# Patient Record
Sex: Female | Born: 1970 | Race: Asian | Hispanic: No | Marital: Married | Smoking: Never smoker
Health system: Southern US, Community
[De-identification: ages and names within clinical notes are randomized; demographics above are authoritative.]

---

## 2020-07-18 ENCOUNTER — Other Ambulatory Visit: Payer: Self-pay

## 2020-07-18 ENCOUNTER — Emergency Department (HOSPITAL_COMMUNITY)
Admission: EM | Admit: 2020-07-18 | Discharge: 2020-07-18 | Disposition: A | Discharge status: Home / Self Care | Payer: BC Managed Care – PPO | Attending: Emergency Medicine | Admitting: Emergency Medicine

## 2020-07-18 ENCOUNTER — Emergency Department (HOSPITAL_COMMUNITY): Payer: BC Managed Care – PPO

## 2020-07-18 ENCOUNTER — Encounter (HOSPITAL_COMMUNITY): Payer: Self-pay

## 2020-07-18 DIAGNOSIS — M542 Cervicalgia: Secondary | ICD-10-CM | POA: Insufficient documentation

## 2020-07-18 DIAGNOSIS — R42 Dizziness and giddiness: Secondary | ICD-10-CM | POA: Diagnosis not present

## 2020-07-18 DIAGNOSIS — R55 Syncope and collapse: Secondary | ICD-10-CM | POA: Diagnosis present

## 2020-07-18 DIAGNOSIS — M62838 Other muscle spasm: Secondary | ICD-10-CM

## 2020-07-18 LAB — I-STAT BETA HCG BLOOD, ED (MC, WL, AP ONLY): I-stat hCG, quantitative: <5 m[IU]/mL (ref <5)

## 2020-07-18 LAB — CBC WITH DIFFERENTIAL/PLATELET
Abs Immature Granulocytes: 0.01 10*3/uL (ref 0.00–0.07)
Basophils Absolute: 0 10*3/uL (ref 0.0–0.1)
Basophils Relative: 0 %
Eosinophils Absolute: 0 10*3/uL (ref 0.0–0.5)
Eosinophils Relative: 1 %
HCT: 37.3 % (ref 36.0–46.0)
Hemoglobin: 12.3 g/dL (ref 12.0–15.0)
Immature Granulocytes: 0 %
Lymphocytes Relative: 26 %
Lymphs Abs: 1.5 10*3/uL (ref 0.7–4.0)
MCH: 31.7 pg (ref 26.0–34.0)
MCHC: 33 g/dL (ref 30.0–36.0)
MCV: 96.1 fL (ref 80.0–100.0)
Monocytes Absolute: 0.4 10*3/uL (ref 0.1–1.0)
Monocytes Relative: 6 %
Neutro Abs: 3.9 10*3/uL (ref 1.7–7.7)
Neutrophils Relative %: 67 %
Platelets: 205 10*3/uL (ref 150–400)
RBC: 3.88 MIL/uL (ref 3.87–5.11)
RDW: 11.9 % (ref 11.5–15.5)
WBC: 5.8 10*3/uL (ref 4.0–10.5)
nRBC: 0 % (ref 0.0–0.2)

## 2020-07-18 LAB — I-STAT CHEM 8, ED
BUN: 15 mg/dL (ref 6–20)
Calcium, Ion: 1.2 mmol/L (ref 1.15–1.40)
Chloride: 103 mmol/L (ref 98–111)
Creatinine, Ser: 0.7 mg/dL (ref 0.44–1.00)
Glucose, Bld: 105 mg/dL — ABNORMAL HIGH (ref 70–99)
HCT: 34 % — ABNORMAL LOW (ref 36.0–46.0)
Hemoglobin: 11.6 g/dL — ABNORMAL LOW (ref 12.0–15.0)
Potassium: 3.5 mmol/L (ref 3.5–5.1)
Sodium: 143 mmol/L (ref 135–145)
TCO2: 31 mmol/L (ref 22–32)

## 2020-07-18 LAB — COMPREHENSIVE METABOLIC PANEL
ALT: 22 U/L (ref 0–44)
AST: 21 U/L (ref 15–41)
Albumin: 3.8 g/dL (ref 3.5–5.0)
Alkaline Phosphatase: 56 U/L (ref 38–126)
Anion gap: 8 (ref 5–15)
BUN: 12 mg/dL (ref 6–20)
CO2: 30 mmol/L (ref 22–32)
Calcium: 8.9 mg/dL (ref 8.9–10.3)
Chloride: 103 mmol/L (ref 98–111)
Creatinine, Ser: 0.75 mg/dL (ref 0.44–1.00)
GFR, Estimated: >60 mL/min (ref >60)
Glucose, Bld: 101 mg/dL — ABNORMAL HIGH (ref 70–99)
Potassium: 3.4 mmol/L — ABNORMAL LOW (ref 3.5–5.1)
Sodium: 141 mmol/L (ref 135–145)
Total Bilirubin: 0.7 mg/dL (ref 0.3–1.2)
Total Protein: 6.5 g/dL (ref 6.5–8.1)

## 2020-07-18 LAB — TROPONIN I (HIGH SENSITIVITY)
Troponin I (High Sensitivity): 2 ng/L (ref <18)
Troponin I (High Sensitivity): 3 ng/L (ref <18)

## 2020-07-18 MED ORDER — METHOCARBAMOL 500 MG PO TABS: 500.0000 mg | ORAL_TABLET | Freq: Two times a day (BID) | ORAL | 0 refills | Status: AC

## 2020-07-18 MED ORDER — IOHEXOL 350 MG/ML SOLN
50.0000 mL | Freq: Once | INTRAVENOUS | Status: AC | PRN
  Administered 2020-07-18: 50 mL via INTRAVENOUS

## 2020-07-18 MED ORDER — IBUPROFEN 600 MG PO TABS: 600.0000 mg | ORAL_TABLET | Freq: Four times a day (QID) | ORAL | 0 refills | Status: AC | PRN

## 2020-07-18 MED ORDER — SODIUM CHLORIDE 0.9 % IV BOLUS
1000.0000 mL | Freq: Once | INTRAVENOUS | Status: AC
  Administered 2020-07-18: 1000 mL via INTRAVENOUS

## 2020-07-18 MED ORDER — CYCLOBENZAPRINE HCL 10 MG PO TABS
5.0000 mg | ORAL_TABLET | Freq: Once | ORAL | Status: AC
  Administered 2020-07-18: 5 mg via ORAL
  Filled 2020-07-18: qty 1

## 2020-07-18 MED ORDER — KETOROLAC TROMETHAMINE 30 MG/ML IJ SOLN
30.0000 mg | Freq: Once | INTRAMUSCULAR | Status: AC
  Administered 2020-07-18: 30 mg via INTRAVENOUS
  Filled 2020-07-18: qty 1

## 2020-07-18 NOTE — Discharge Instructions (Addendum)
You have neck spasms and likely have menopause symptoms   Take Motrin for pain.  Take robaxin for severe spasms   See your doctor for follow up.  Consider following up with cardiology if you persistent syncope  Return to ER if you have worse neck pain, chest pain, passing out

## 2020-07-18 NOTE — ED Triage Notes (Signed)
Patient at work, unaware of sudden syncopal episode. Reports some dizziness prior to fall. Unknown if she hit her head. "feels like she is drunk." Reports neck pain that started last night before fall. VSS, CBG normal. No hx of episodes like this.

## 2020-07-18 NOTE — ED Provider Notes (Signed)
[facility] EMERGENCY DEPARTMENT Provider Note   CSN: [id number] Arrival date & time: 07/18/20  1700     History Chief Complaint  Patient presents with  . Loss of Consciousness    Meredith Smith is a 50 y.o. female here with dizziness and near syncopal episode.  Patient states that about a week ago she had a rash and went to urgent care was given some steroids and then she had a first episode of near syncope soon after that.  She states that she has been having neck pain for the last several days.  She also states that she has menopause symptoms and just felt lightheaded and dizzy.  Patient states that today she went to work and her neck was hurting on the left side.  She turned her neck and she almost passed out.  She denies any falls or actually passing out or hitting her head.  Denies any trouble speaking or weakness or numbness.  The history is provided by the patient.       History reviewed. No pertinent past medical history.  There are no problems to display for this patient.   History reviewed. No pertinent surgical history.   OB History   No obstetric history on file.     History reviewed. No pertinent family history.  Social History   Tobacco Use  . Smoking status: Never Smoker  . Smokeless tobacco: Never Used    Home Medications Prior to Admission medications   Not on File    Allergies    Patient has no known allergies.  Review of Systems   Review of Systems  Cardiovascular: Positive for syncope.  Neurological: Positive for dizziness and light-headedness.  All other systems reviewed and are negative.   Physical Exam Updated Vital Signs BP (!) 145/79   Pulse 68   Temp 98.4 F (36.9 C) (Oral)   Resp 16   Ht 5\' 4"  (1.626 m)   Wt 55.3 kg   SpO2 98%   BMI 20.94 kg/m   Physical Exam Vitals and nursing note reviewed.  Constitutional:      Appearance: Normal appearance.  HENT:     Head: Normocephalic.     Nose: Nose normal.      Mouth/Throat:     Mouth: Mucous membranes are moist.  Eyes:     Extraocular Movements: Extraocular movements intact.     Pupils: Pupils are equal, round, and reactive to light.  Neck:     Comments: + Patient has some left para cervical tenderness.  No obvious bruit.  No meningeal sign  Cardiovascular:     Rate and Rhythm: Normal rate and regular rhythm.     Pulses: Normal pulses.     Heart sounds: Normal heart sounds.  Pulmonary:     Effort: Pulmonary effort is normal.     Breath sounds: Normal breath sounds.  Abdominal:     General: Abdomen is flat.     Palpations: Abdomen is soft.  Musculoskeletal:        General: Normal range of motion.  Skin:    General: Skin is warm.     Capillary Refill: Capillary refill takes less than 2 seconds.  Neurological:     General: No focal deficit present.     Mental Status: She is alert and oriented to person, place, and time.     Comments: CN 2- 12 intact, nl strength throughout, nl finger to nose bilaterally   Psychiatric:  Mood and Affect: Mood normal.        Behavior: Behavior normal.     ED Results / Procedures / Treatments   Labs (all labs ordered are listed, but only abnormal results are displayed) Labs Reviewed  COMPREHENSIVE METABOLIC PANEL - Abnormal; Notable for the following components:      Result Value   Potassium 3.4 (*)    Glucose, Bld 101 (*)    All other components within normal limits  I-STAT CHEM 8, ED - Abnormal; Notable for the following components:   Glucose, Bld 105 (*)    Hemoglobin 11.6 (*)    HCT 34.0 (*)    All other components within normal limits  CBC WITH DIFFERENTIAL/PLATELET  I-STAT BETA HCG BLOOD, ED ([facility], [facility], [facility] ONLY)  TROPONIN I (HIGH SENSITIVITY)  TROPONIN I (HIGH SENSITIVITY)    EKG EKG Interpretation  Date/Time:  Tuesday Jul 18 2020 17:04:07 EDT Ventricular Rate:  76 PR Interval:  159 QRS Duration: 84 QT Interval:  416 QTC Calculation: 468 R Axis:   75 Text  Interpretation: Sinus rhythm No significant change since last tracing Confirmed by Richardean Canal [phone]) on 07/18/2020 5:32:33 PM   Radiology CT Angio Head W or Wo Contrast  Result Date: 07/18/2020 CLINICAL DATA:  Initial evaluation for dizziness, syncope. EXAM: CT ANGIOGRAPHY HEAD AND NECK TECHNIQUE: Multidetector CT imaging of the head and neck was performed using the standard protocol during bolus administration of intravenous contrast. Multiplanar CT image reconstructions and MIPs were obtained to evaluate the vascular anatomy. Carotid stenosis measurements (when applicable) are obtained utilizing NASCET criteria, using the distal internal carotid diameter as the denominator. CONTRAST:  74mL OMNIPAQUE IOHEXOL 350 MG/ML SOLN COMPARISON:  None. FINDINGS: CT HEAD FINDINGS Brain: Cerebral volume within normal limits for patient age. No evidence for acute intracranial hemorrhage. No findings to suggest acute large vessel territory infarct. No mass lesion, midline shift, or mass effect. Ventricles are normal in size without evidence for hydrocephalus. No extra-axial fluid collection identified. Vascular: No hyperdense vessel identified. Skull: Scalp soft tissues demonstrate no acute abnormality. Calvarium intact. Sinuses/Orbits: Globes and orbital soft tissues within normal limits. Visualized paranasal sinuses are clear. No mastoid effusion. CTA NECK FINDINGS Aortic arch: Visualized aortic arch normal caliber with normal branch pattern. No stenosis or other abnormality about the origin of the great vessels. Mild atheromatous plaque within the proximal descending intrathoracic aorta noted. Right carotid system: Right common and internal carotid arteries widely patent without stenosis, dissection or occlusion. Left carotid system: Left common and internal carotid arteries widely patent without stenosis, dissection or occlusion. Vertebral arteries: Both vertebral arteries arise from the subclavian arteries. No  proximal subclavian artery stenosis. Both vertebral arteries widely patent without stenosis, dissection or occlusion. Skeleton: Unremarkable. Other neck: No other acute soft tissue abnormality within the neck. No mass or adenopathy. Upper chest: Visualized upper chest demonstrates no acute finding. Review of the MIP images confirms the above findings CTA HEAD FINDINGS Anterior circulation: Both internal carotid arteries widely patent to the termini without stenosis. A1 segments widely patent. Normal anterior communicating artery complex. Both anterior cerebral arteries widely patent to their distal aspects without stenosis. No M1 stenosis or occlusion. Normal left MCA bifurcation. Evaluation of the right MCA bifurcation somewhat limited by adjacent venous contamination. Distal MCA branches well perfused and symmetric. Posterior circulation: Both V4 segments patent to the vertebrobasilar junction without stenosis. Both PICA origins patent and normal. Basilar widely patent to its distal aspect without stenosis. Superior cerebellar arteries  patent bilaterally. Both PCAs primarily supplied via the basilar and are well perfused to there distal aspects. Venous sinuses: Grossly patent allowing for timing the contrast bolus. Anatomic variants: None significant.  No aneurysm. Review of the MIP images confirms the above findings IMPRESSION: 1. Normal CTA of the head and neck. No large vessel occlusion, hemodynamically significant stenosis, or other acute vascular abnormality. 2. No other acute intracranial abnormality. Electronically Signed   By: Rise Mu M.D.   On: 07/18/2020 19:46   CT Angio Neck W and/or Wo Contrast  Result Date: 07/18/2020 CLINICAL DATA:  Initial evaluation for dizziness, syncope. EXAM: CT ANGIOGRAPHY HEAD AND NECK TECHNIQUE: Multidetector CT imaging of the head and neck was performed using the standard protocol during bolus administration of intravenous contrast. Multiplanar CT image  reconstructions and MIPs were obtained to evaluate the vascular anatomy. Carotid stenosis measurements (when applicable) are obtained utilizing NASCET criteria, using the distal internal carotid diameter as the denominator. CONTRAST:  8mL OMNIPAQUE IOHEXOL 350 MG/ML SOLN COMPARISON:  None. FINDINGS: CT HEAD FINDINGS Brain: Cerebral volume within normal limits for patient age. No evidence for acute intracranial hemorrhage. No findings to suggest acute large vessel territory infarct. No mass lesion, midline shift, or mass effect. Ventricles are normal in size without evidence for hydrocephalus. No extra-axial fluid collection identified. Vascular: No hyperdense vessel identified. Skull: Scalp soft tissues demonstrate no acute abnormality. Calvarium intact. Sinuses/Orbits: Globes and orbital soft tissues within normal limits. Visualized paranasal sinuses are clear. No mastoid effusion. CTA NECK FINDINGS Aortic arch: Visualized aortic arch normal caliber with normal branch pattern. No stenosis or other abnormality about the origin of the great vessels. Mild atheromatous plaque within the proximal descending intrathoracic aorta noted. Right carotid system: Right common and internal carotid arteries widely patent without stenosis, dissection or occlusion. Left carotid system: Left common and internal carotid arteries widely patent without stenosis, dissection or occlusion. Vertebral arteries: Both vertebral arteries arise from the subclavian arteries. No proximal subclavian artery stenosis. Both vertebral arteries widely patent without stenosis, dissection or occlusion. Skeleton: Unremarkable. Other neck: No other acute soft tissue abnormality within the neck. No mass or adenopathy. Upper chest: Visualized upper chest demonstrates no acute finding. Review of the MIP images confirms the above findings CTA HEAD FINDINGS Anterior circulation: Both internal carotid arteries widely patent to the termini without stenosis. A1  segments widely patent. Normal anterior communicating artery complex. Both anterior cerebral arteries widely patent to their distal aspects without stenosis. No M1 stenosis or occlusion. Normal left MCA bifurcation. Evaluation of the right MCA bifurcation somewhat limited by adjacent venous contamination. Distal MCA branches well perfused and symmetric. Posterior circulation: Both V4 segments patent to the vertebrobasilar junction without stenosis. Both PICA origins patent and normal. Basilar widely patent to its distal aspect without stenosis. Superior cerebellar arteries patent bilaterally. Both PCAs primarily supplied via the basilar and are well perfused to there distal aspects. Venous sinuses: Grossly patent allowing for timing the contrast bolus. Anatomic variants: None significant.  No aneurysm. Review of the MIP images confirms the above findings IMPRESSION: 1. Normal CTA of the head and neck. No large vessel occlusion, hemodynamically significant stenosis, or other acute vascular abnormality. 2. No other acute intracranial abnormality. Electronically Signed   By: Rise Mu M.D.   On: 07/18/2020 19:46    Procedures Procedures   Medications Ordered in ED Medications  ketorolac (TORADOL) 30 MG/ML injection 30 mg (has no administration in time range)  cyclobenzaprine (FLEXERIL) tablet 5 mg (has no  administration in time range)  sodium chloride 0.9 % bolus 1,000 mL (0 mLs Intravenous Stopped 07/18/20 1959)  iohexol (OMNIPAQUE) 350 MG/ML injection 50 mL (50 mLs Intravenous Contrast Given 07/18/20 1853)    ED Course  I have reviewed the triage vital signs and the nursing notes.  Pertinent labs & imaging results that were available during my care of the patient were reviewed by me and considered in my medical decision making (see chart for details).    MDM Rules/Calculators/A&P                         Galen ManilaSung Messing is a 50 y.o. female here with dizziness and neck pain and near syncope.  We  will get a CTA to rule out dissection.  But I think patient likely had a near syncopal event.  We will also get basic blood work and a troponin as well.  Will hydrate patient and get orthostasis.  9:26 PM CTA showed no LVO.  Likely muscle spasms.  Patient also has menopause symptoms which is likely causing her night sweats and dizziness and near syncopal episodes.  Her troponins are negative x2.  We will have her follow-up outpatient with her doctor.  We will give some Motrin and Robaxin as needed for neck pain.   Final Clinical Impression(s) / ED Diagnoses Final diagnoses:  None    Rx / DC Orders ED Discharge Orders    None       Charlynne PanderYao, Tarry Blayney Hsienta, MD 07/18/20 2128

## 2020-07-18 NOTE — ED Notes (Signed)
Patient reports that she is going through menopause and has continually had "weird," feelings.

## 2020-07-26 ENCOUNTER — Telehealth: Payer: Self-pay

## 2020-07-26 NOTE — Telephone Encounter (Signed)
Progress notes in file sent from [facility] [place], phone: [phone]. Copy of referral in scheduling

## 2020-09-29 ENCOUNTER — Ambulatory Visit: Payer: BC Managed Care – PPO | Admitting: Internal Medicine

## 2020-12-19 ENCOUNTER — Telehealth: Payer: Self-pay

## 2020-12-19 NOTE — Telephone Encounter (Signed)
NOTES SCANNED TO REFERRAL 

## 2022-06-09 IMAGING — CT CT ANGIO NECK
1 of 11 series · 5 of 33 positions shown · IV contrast (omnipaque)
Comparison: None.

CLINICAL DATA: Initial evaluation for dizziness, syncope.

EXAM:
CT ANGIOGRAPHY HEAD AND NECK
TECHNIQUE: Multidetector CT imaging of the head and neck was performed using
the standard protocol during bolus administration of intravenous
contrast. Multiplanar CT image reconstructions and MIPs were
obtained to evaluate the vascular anatomy. Carotid stenosis
measurements (when applicable) are obtained utilizing NASCET
criteria, using the distal internal carotid diameter as the
denominator.
CONTRAST:  50mL OMNIPAQUE IOHEXOL 350 MG/ML SOLN

[Series 11: ax thins (person_name) · axial · 0.38mm/px · z∈[+903,+1127]mm · 5 of 337 slices shown]
[im 57/337  soft-tissue]
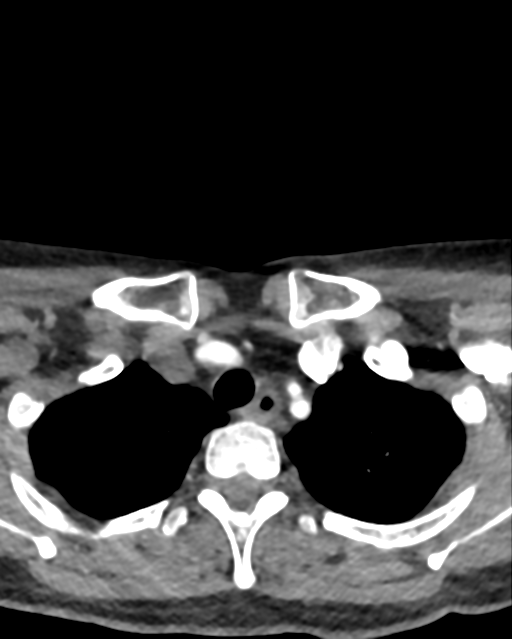
[im 113/337  bone]
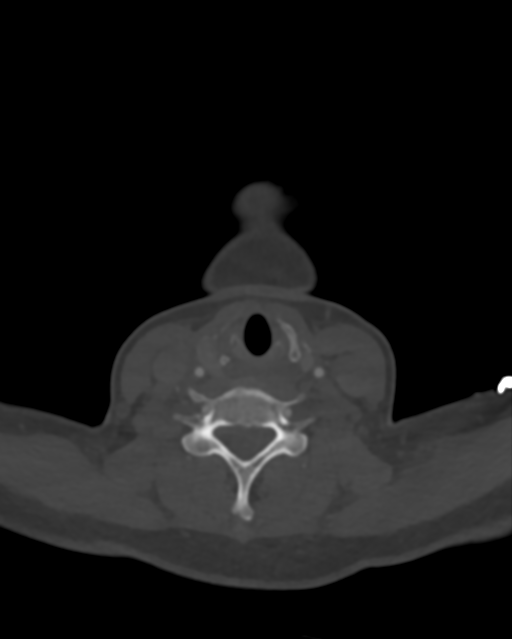
[im 169/337  soft-tissue]
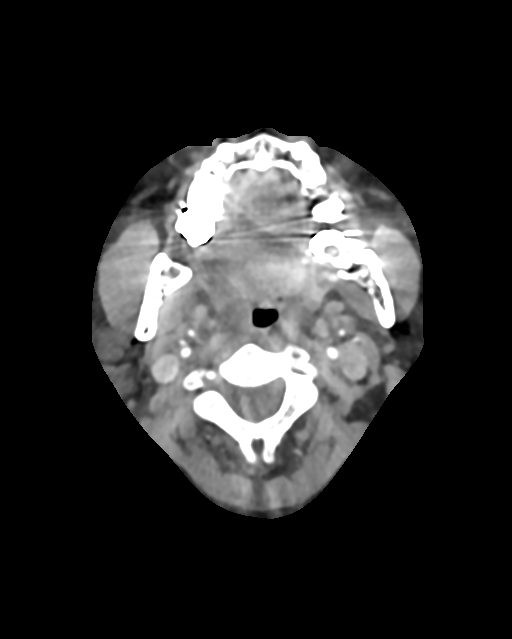
[im 225/337  bone]
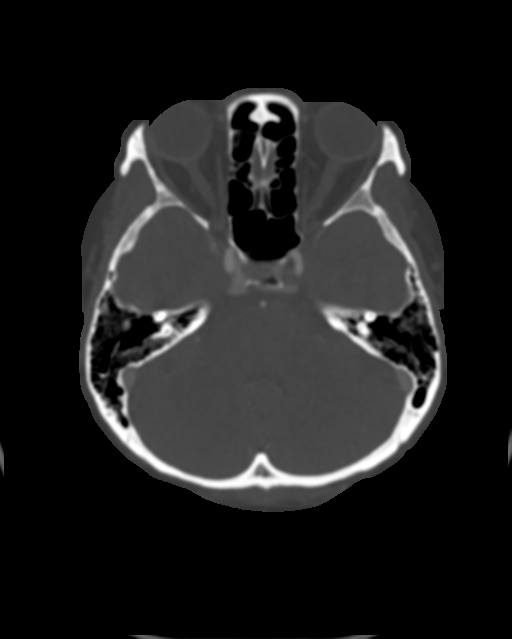
[im 281/337  soft-tissue]
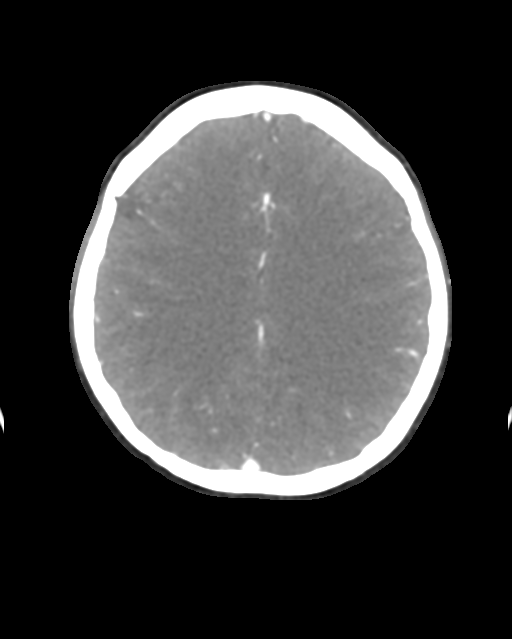

[5 of 33 positions shown; findings below may reference images not displayed]

FINDINGS: CT HEAD FINDINGS

Brain: Cerebral volume within normal limits for patient age.

No evidence for acute intracranial hemorrhage. No findings to
suggest acute large vessel territory infarct. No mass lesion,
midline shift, or mass effect. Ventricles are normal in size without
evidence for hydrocephalus. No extra-axial fluid collection
identified.

Vascular: No hyperdense vessel identified.

Skull: Scalp soft tissues demonstrate no acute abnormality.
Calvarium intact.

Sinuses/Orbits: Globes and orbital soft tissues within normal
limits.

Visualized paranasal sinuses are clear. No mastoid effusion.

CTA NECK FINDINGS

Aortic arch: Visualized aortic arch normal caliber with normal
branch pattern. No stenosis or other abnormality about the origin of
the great vessels. Mild atheromatous plaque within the proximal
descending intrathoracic aorta noted.

Right carotid system: Right common and internal carotid arteries
widely patent without stenosis, dissection or occlusion.

Left carotid system: Left common and internal carotid arteries
widely patent without stenosis, dissection or occlusion.

Vertebral arteries: Both vertebral arteries arise from the
subclavian arteries. No proximal subclavian artery stenosis. Both
vertebral arteries widely patent without stenosis, dissection or
occlusion.

Skeleton: Unremarkable.

Other neck: No other acute soft tissue abnormality within the neck.
No mass or adenopathy.

Upper chest: Visualized upper chest demonstrates no acute finding.

Review of the MIP images confirms the above findings

CTA HEAD FINDINGS

Anterior circulation: Both internal carotid arteries widely patent
to the termini without stenosis. A1 segments widely patent. Normal
anterior communicating artery complex. Both anterior cerebral
arteries widely patent to their distal aspects without stenosis. No
M1 stenosis or occlusion. Normal left MCA bifurcation. Evaluation of
the right MCA bifurcation somewhat limited by adjacent venous
contamination. Distal MCA branches well perfused and symmetric.

Posterior circulation: Both V4 segments patent to the
vertebrobasilar junction without stenosis. Both PICA origins patent
and normal. Basilar widely patent to its distal aspect without
stenosis. Superior cerebellar arteries patent bilaterally. Both PCAs
primarily supplied via the basilar and are well perfused to there
distal aspects.

Venous sinuses: Grossly patent allowing for timing the contrast
bolus.

Anatomic variants: None significant.  No aneurysm.

Review of the MIP images confirms the above findings
IMPRESSION: 1. Normal CTA of the head and neck. No large vessel occlusion,
hemodynamically significant stenosis, or other acute vascular
abnormality.
2. No other acute intracranial abnormality.
